# Patient Record
Sex: Male | Born: 1960 | Race: Black or African American | Hispanic: No | Marital: Single | Smoking: Current every day smoker
Health system: Southern US, Community
[De-identification: ages and names within clinical notes are randomized; demographics above are authoritative.]

---

## 2014-10-13 ENCOUNTER — Emergency Department (HOSPITAL_COMMUNITY)
Admission: EM | Admit: 2014-10-13 | Discharge: 2014-10-13 | Disposition: A | Discharge status: Home / Self Care | Payer: Worker's Compensation | Attending: Emergency Medicine | Admitting: Emergency Medicine

## 2014-10-13 ENCOUNTER — Emergency Department (HOSPITAL_COMMUNITY): Payer: Worker's Compensation

## 2014-10-13 ENCOUNTER — Encounter (HOSPITAL_COMMUNITY): Payer: Self-pay | Admitting: Emergency Medicine

## 2014-10-13 DIAGNOSIS — Y9289 Other specified places as the place of occurrence of the external cause: Secondary | ICD-10-CM | POA: Insufficient documentation

## 2014-10-13 DIAGNOSIS — M549 Dorsalgia, unspecified: Secondary | ICD-10-CM

## 2014-10-13 DIAGNOSIS — S3992XA Unspecified injury of lower back, initial encounter: Secondary | ICD-10-CM | POA: Diagnosis not present

## 2014-10-13 DIAGNOSIS — Y9389 Activity, other specified: Secondary | ICD-10-CM | POA: Insufficient documentation

## 2014-10-13 DIAGNOSIS — Z720 Tobacco use: Secondary | ICD-10-CM | POA: Insufficient documentation

## 2014-10-13 DIAGNOSIS — Z79899 Other long term (current) drug therapy: Secondary | ICD-10-CM | POA: Diagnosis not present

## 2014-10-13 MED ORDER — IBUPROFEN 800 MG PO TABS: 800.0000 mg | ORAL_TABLET | Freq: Three times a day (TID) | ORAL | Status: DC

## 2014-10-13 MED ORDER — HYDROCODONE-ACETAMINOPHEN 5-325 MG PO TABS: 2.0000 | ORAL_TABLET | ORAL | Status: DC | PRN

## 2014-10-13 NOTE — Discharge Instructions (Signed)
Back Pain, Adult °Low back pain is very common. About 1 in 5 people have back pain. The cause of low back pain is rarely dangerous. The pain often gets better over time. About half of people with a sudden onset of back pain feel better in just 2 weeks. About 8 in 10 people feel better by 6 weeks.  °CAUSES °Some common causes of back pain include: °· Strain of the muscles or ligaments supporting the spine. °· Wear and tear (degeneration) of the spinal discs. °· Arthritis. °· Direct injury to the back. °DIAGNOSIS °Most of the time, the direct cause of low back pain is not known. However, back pain can be treated effectively even when the exact cause of the pain is unknown. Answering your caregiver's questions about your overall health and symptoms is one of the most accurate ways to make sure the cause of your pain is not dangerous. If your caregiver needs more information, he or she may order lab work or imaging tests (X-rays or MRIs). However, even if imaging tests show changes in your back, this usually does not require surgery. °HOME CARE INSTRUCTIONS °For many people, back pain returns. Since low back pain is rarely dangerous, it is often a condition that people can learn to manage on their own.  °· Remain active. It is stressful on the back to sit or stand in one place. Do not sit, drive, or stand in one place for more than 30 minutes at a time. Take short walks on level surfaces as soon as pain allows. Try to increase the length of time you walk each day. °· Do not stay in bed. Resting more than 1 or 2 days can delay your recovery. °· Do not avoid exercise or work. Your body is made to move. It is not dangerous to be active, even though your back may hurt. Your back will likely heal faster if you return to being active before your pain is gone. °· Pay attention to your body when you  bend and lift. Many people have less discomfort when lifting if they bend their knees, keep the load close to their bodies, and  avoid twisting. Often, the most comfortable positions are those that put less stress on your recovering back. °· Find a comfortable position to sleep. Use a firm mattress and lie on your side with your knees slightly bent. If you lie on your back, put a pillow under your knees. °· Only take over-the-counter or prescription medicines as directed by your caregiver. Over-the-counter medicines to reduce pain and inflammation are often the most helpful. Your caregiver may prescribe muscle relaxant drugs. These medicines help dull your pain so you can more quickly return to your normal activities and healthy exercise. °· Put ice on the injured area. °¨ Put ice in a plastic bag. °¨ Place a towel between your skin and the bag. °¨ Leave the ice on for 15-20 minutes, 03-04 times a day for the first 2 to 3 days. After that, ice and heat may be alternated to reduce pain and spasms. °· Ask your caregiver about trying back exercises and gentle massage. This may be of some benefit. °· Avoid feeling anxious or stressed. Stress increases muscle tension and can worsen back pain. It is important to recognize when you are anxious or stressed and learn ways to manage it. Exercise is a great option. °SEEK MEDICAL CARE IF: °· You have pain that is not relieved with rest or medicine. °· You have pain that does not improve in 1 week. °· You have new symptoms. °· You are generally not feeling well. °SEEK   IMMEDIATE MEDICAL CARE IF:  °· You have pain that radiates from your back into your legs. °· You develop new bowel or bladder control problems. °· You have unusual weakness or numbness in your arms or legs. °· You develop nausea or vomiting. °· You develop abdominal pain. °· You feel faint. °Document Released: 12/01/2005 Document Revised: 06/01/2012 Document Reviewed: 04/04/2014 °Exit[employer]® Patient Information ©2015 [employer]. This information is not intended to replace advice given to you by your health care provider. Make sure you  discuss any questions you have with your health care provider. ° °Contusion °A contusion is a deep bruise. Contusions are the result of an injury that caused bleeding under the skin. The contusion may turn blue, purple, or yellow. Minor injuries will give you a painless contusion, but more severe contusions may stay painful and swollen for a few weeks.  °CAUSES  °A contusion is usually caused by a blow, trauma, or direct force to an area of the body. °SYMPTOMS  °· Swelling and redness of the injured area. °· Bruising of the injured area. °· Tenderness and soreness of the injured area. °· Pain. °DIAGNOSIS  °The diagnosis can be made by taking a history and physical exam. An X-ray, CT scan, or MRI may be needed to determine if there were any associated injuries, such as fractures. °TREATMENT  °Specific treatment will depend on what area of the body was injured. In general, the best treatment for a contusion is resting, icing, elevating, and applying cold compresses to the injured area. Over-the-counter medicines may also be recommended for pain control. Ask your caregiver what the best treatment is for your contusion. °HOME CARE INSTRUCTIONS  °· Put ice on the injured area. °¨ Put ice in a plastic bag. °¨ Place a towel between your skin and the bag. °¨ Leave the ice on for 15-20 minutes, 3-4 times a day, or as directed by your health care provider. °· Only take over-the-counter or prescription medicines for pain, discomfort, or fever as directed by your caregiver. Your caregiver may recommend avoiding anti-inflammatory medicines (aspirin, ibuprofen, and naproxen) for 48 hours because these medicines may increase bruising. °· Rest the injured area. °· If possible, elevate the injured area to reduce swelling. °SEEK IMMEDIATE MEDICAL CARE IF:  °· You have increased bruising or swelling. °· You have pain that is getting worse. °· Your swelling or pain is not relieved with medicines. °MAKE SURE YOU:  °· Understand these  instructions. °· Will watch your condition. °· Will get help right away if you are not doing well or get worse. °Document Released: 09/10/2005 Document Revised: 12/06/2013 Document Reviewed: 10/06/2011 °ExitCare® Patient Information ©2015 [employer]. This information is not intended to replace advice given to you by your health care provider. Make sure you discuss any questions you have with your health care provider. ° °

## 2014-10-13 NOTE — ED Provider Notes (Signed)
CSN: [id number]     Arrival date & time 10/13/14  1042 History   First MD Initiated Contact with Patient 10/13/14 1127     Chief Complaint  Patient presents with  . Fall  . Back Pain     (Consider location/radiation/quality/duration/timing/severity/associated sxs/prior Treatment) Patient is a 53 y.o. male presenting with fall and back pain. The history is provided by the patient. No language interpreter was used.  Fall This is a new problem. The current episode started today. The problem occurs constantly. The problem has been gradually worsening. Associated symptoms include myalgias. Nothing aggravates the symptoms. He has tried nothing for the symptoms. The treatment provided moderate relief.  Back Pain Pt complains of pain in his low back after falling off the back of a truck yesterday  History reviewed. No pertinent past medical history. History reviewed. No pertinent past surgical history. No family history on file. History  Substance Use Topics  . Smoking status: Current Every Day Smoker    Types: Cigarettes  . Smokeless tobacco: Not on file  . Alcohol Use: Yes    Review of Systems  Musculoskeletal: Positive for back pain and myalgias.  All other systems reviewed and are negative.     Allergies  Review of patient's allergies indicates no known allergies.  Home Medications   Prior to Admission medications   Medication Sig Start Date End Date Taking? Authorizing Provider  Cyanocobalamin (B-12 PO) Take 1 tablet by mouth daily.   Yes Historical Provider, MD  FOLIC ACID PO Take 1 tablet by mouth daily.   Yes Historical Provider, MD  IRON PO Take 1 tablet by mouth daily.   Yes Historical Provider, MD  Multiple Vitamins-Minerals (MULTIVITAL) tablet Take 1 tablet by mouth daily.   Yes Historical Provider, MD  Multiple Vitamins-Minerals (PREVENT PO) Take 2 tablets by mouth daily. For Mens Prostate health.   Yes Historical Provider, MD   BP 133/76  Pulse 53  Temp(Src)  97.8 F (36.6 C) (Oral)  Resp 17  SpO2 100% Physical Exam  Nursing note and vitals reviewed. Constitutional: He is oriented to person, place, and time. He appears well-developed and well-nourished.  HENT:  Head: Normocephalic.  Eyes: EOM are normal.  Neck: Normal range of motion.  Cardiovascular: Normal rate.   Pulmonary/Chest: Effort normal.  Abdominal: He exhibits no distension.  Musculoskeletal:  Abrasion left upper back,  Tender mid lower back,  From  nv and ns intact  Neurological: He is alert and oriented to person, place, and time.  Skin: Skin is warm.  Psychiatric: He has a normal mood and affect.    ED Course  Procedures (including critical care time) Labs Review Labs Reviewed - No data to display  Imaging Review Dg Lumbar Spine Complete  10/13/2014   CLINICAL DATA:  Fall from a truck with impact in the low back. Mid low back pain, backache, initial encounter.  EXAM: LUMBAR SPINE - COMPLETE 4+ VIEW  COMPARISON:  None.  FINDINGS: Alignment is anatomic. Vertebral body height is maintained. Mild endplate degenerative changes in the lower thoracic spine and L1-2. No definite pars defects.  IMPRESSION: 1. No acute findings. 2. Lower thoracic and upper lumbar spondylosis, mild.   Electronically Signed   By: Leanna BattlesMelinda  Blietz M.D.   On: 10/13/2014 12:00     EKG Interpretation None      MDM   Final diagnoses:  Back ache    Rx for hydrocodone Ibuprofen     Elson AreasLeslie K Laurrie Toppin, PA-C 10/13/14 1542  Elson AreasLeslie K Tovia Kisner, PA-C 10/13/14 [phone]

## 2014-10-13 NOTE — ED Notes (Signed)
Pt was at work and fell hurting his lower left side of his back.  Pt denies hitting his head or LOC.

## 2014-10-13 NOTE — ED Notes (Addendum)
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 Pt ambulatory since event.

## 2014-10-13 NOTE — ED Provider Notes (Signed)
Medical screening examination/treatment/procedure(s) were performed by non-physician practitioner and as supervising physician I was immediately available for consultation/collaboration.     Keyuana Wank DeloGeoffery Lyons, MD 10/13/14 [phone]

## 2015-06-03 IMAGING — CR DG LUMBAR SPINE COMPLETE 4+V
5 series · 5 of 5 positions shown · non-contrast
Comparison: None.

CLINICAL DATA: Fall from a truck with impact in the low back. Mid
low back pain, backache, initial encounter.

EXAM:
LUMBAR SPINE - COMPLETE 4+ VIEW

[t lumbar spine ap]
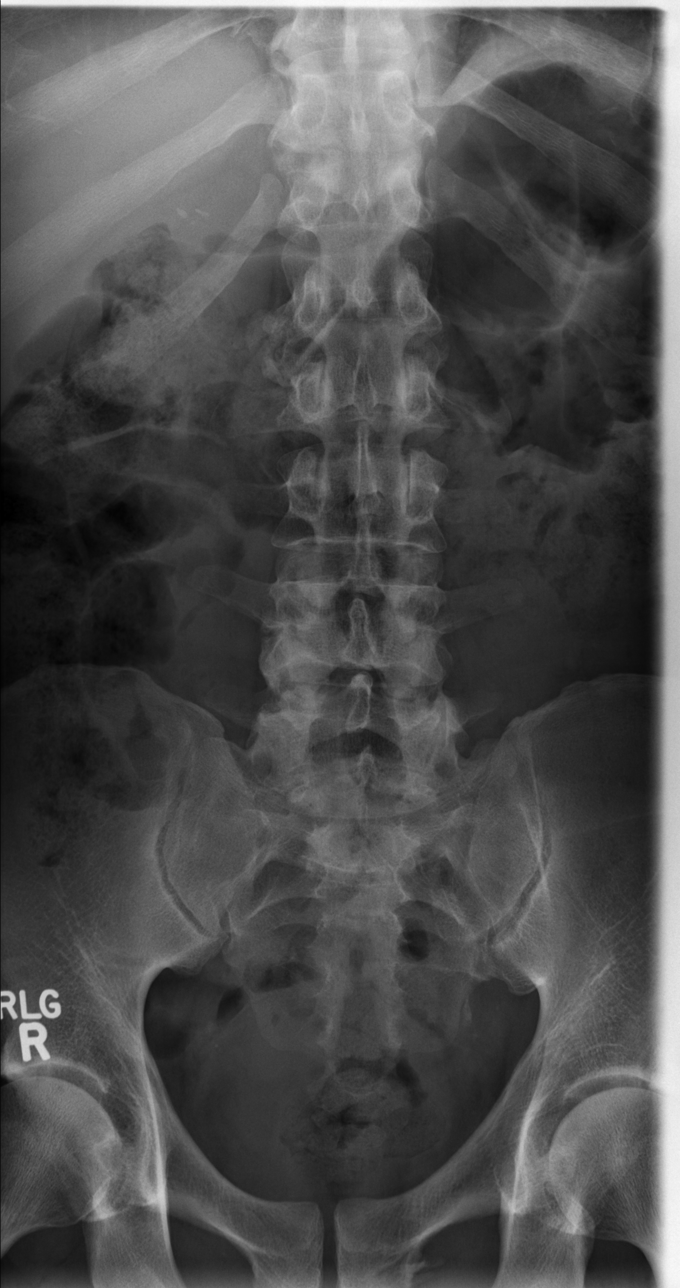

[t lumbar spine obl (1 of 2)]
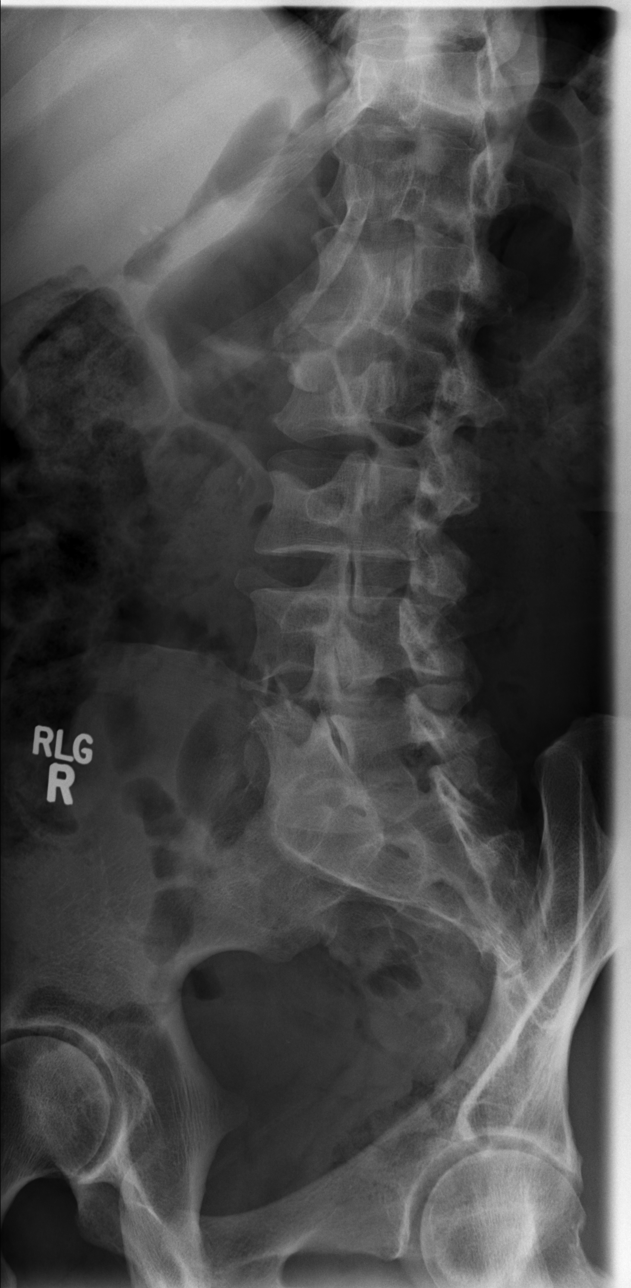

[t lumbar spine obl (2 of 2)]
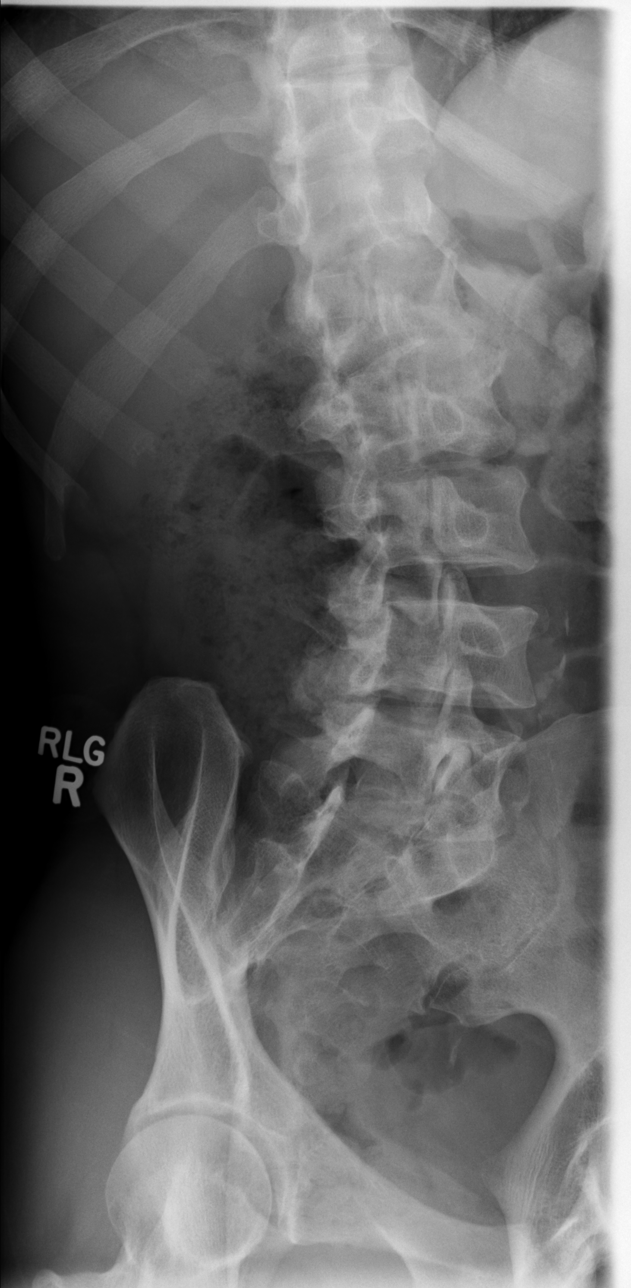

[t lumbar spine lat]
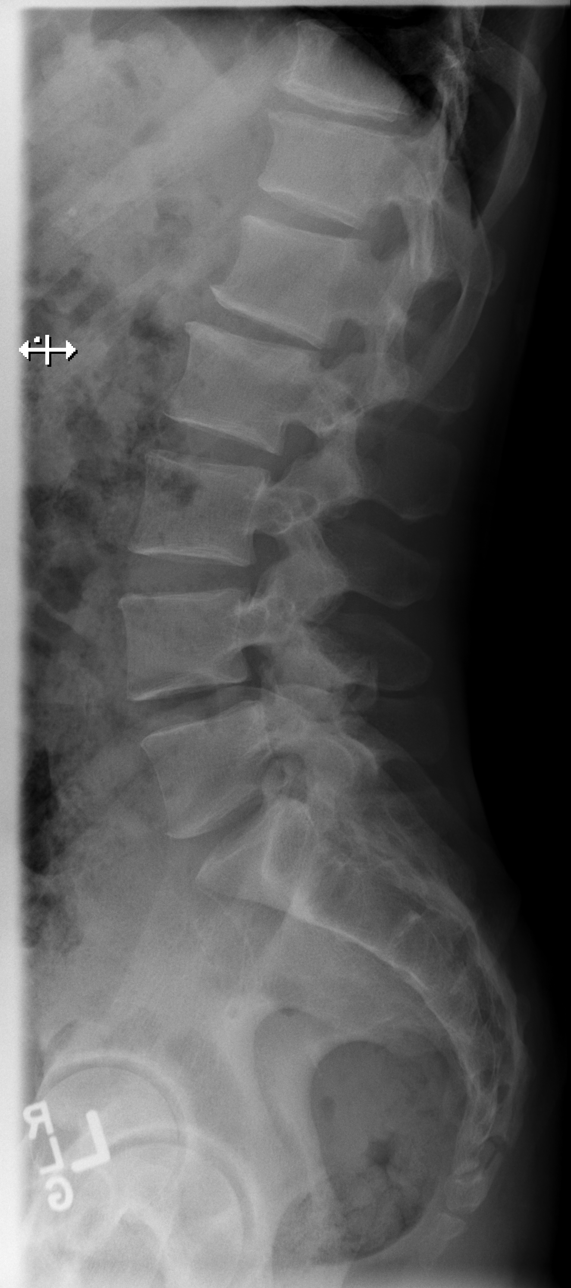

[t lumbar l-5 s-1 spot]
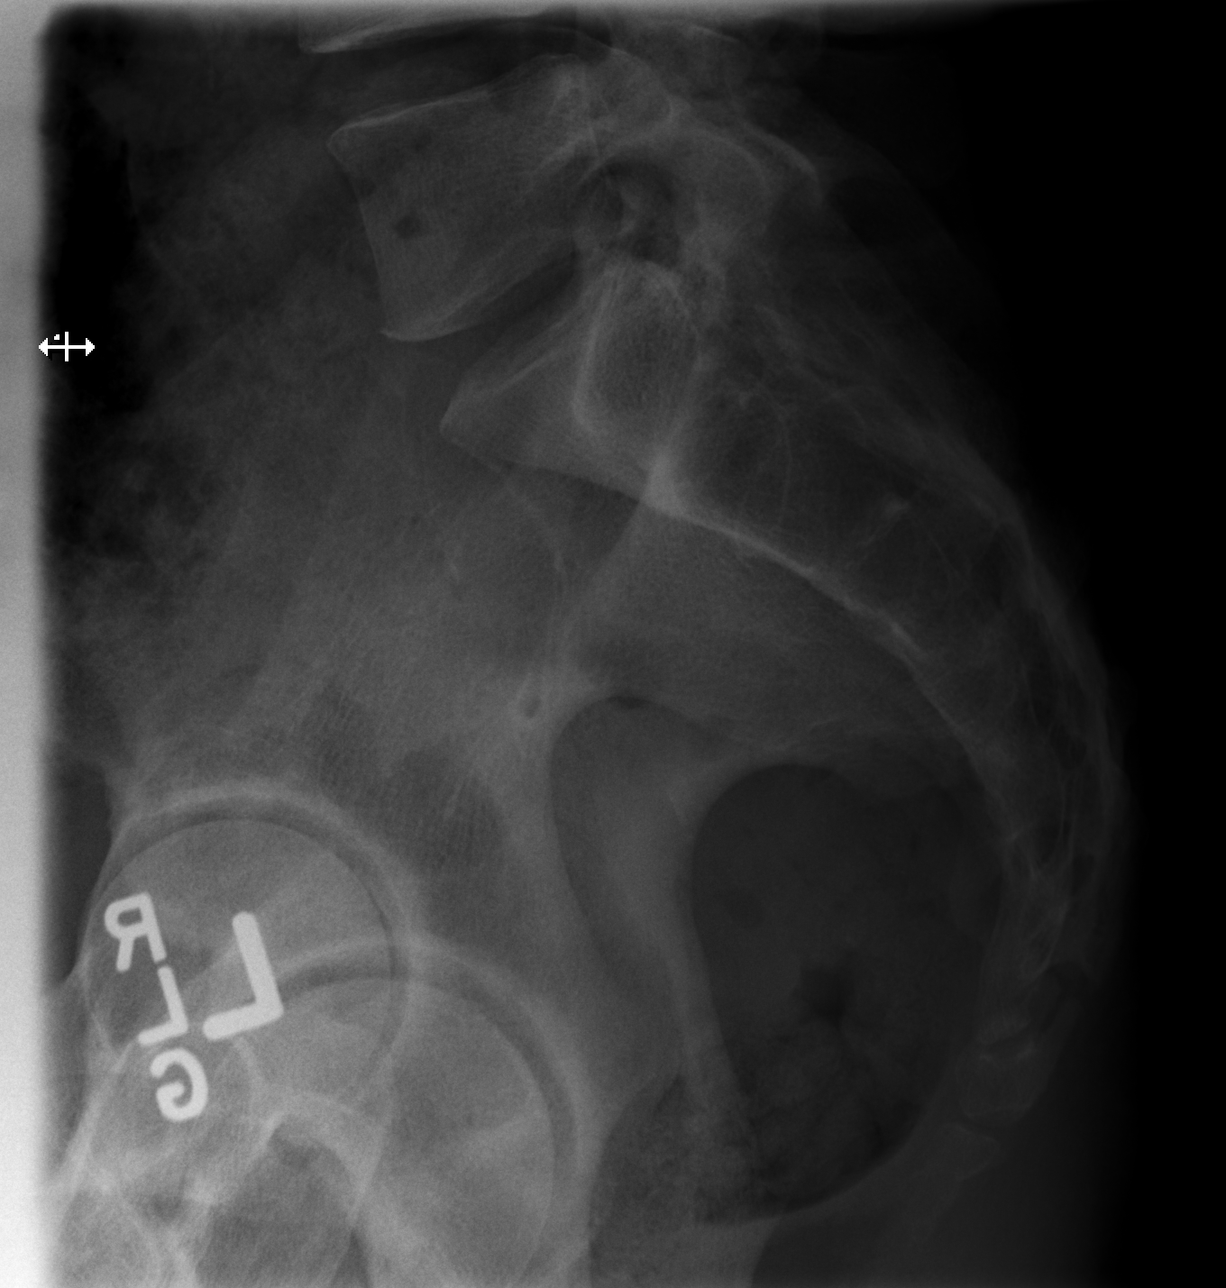

[5 of 5 positions shown; findings below may reference images not displayed]

FINDINGS: Alignment is anatomic. Vertebral body height is maintained. Mild
endplate degenerative changes in the lower thoracic spine and L1-2.
No definite pars defects.
IMPRESSION: 1. No acute findings.
2. Lower thoracic and upper lumbar spondylosis, mild.

## 2021-05-24 ENCOUNTER — Encounter (HOSPITAL_COMMUNITY): Payer: Self-pay | Admitting: Emergency Medicine

## 2021-05-24 ENCOUNTER — Ambulatory Visit (HOSPITAL_COMMUNITY)
Admission: EM | Admit: 2021-05-24 | Discharge: 2021-05-24 | Disposition: A | Discharge status: Home / Self Care | Payer: Self-pay | Attending: Urgent Care | Admitting: Urgent Care

## 2021-05-24 ENCOUNTER — Other Ambulatory Visit: Payer: Self-pay

## 2021-05-24 DIAGNOSIS — M545 Low back pain, unspecified: Secondary | ICD-10-CM

## 2021-05-24 DIAGNOSIS — S39012A Strain of muscle, fascia and tendon of lower back, initial encounter: Secondary | ICD-10-CM

## 2021-05-24 MED ORDER — PREDNISONE 20 MG PO TABS: ORAL_TABLET | ORAL | 0 refills | Status: DC

## 2021-05-24 MED ORDER — TIZANIDINE HCL 4 MG PO TABS: 4.0000 mg | ORAL_TABLET | Freq: Three times a day (TID) | ORAL | 0 refills | Status: DC | PRN

## 2021-05-24 NOTE — ED Provider Notes (Signed)
[facility]   MRN: [id number] DOB: 18-Feb-1961  Subjective:   [facility] is a 60 y.o. male presenting for 3-day history of acute onset persistent right mid to lower back pain, tightness that radiates down his right flank side.  Patient was moving really heavy furniture and performing strenuous lifting activities before his back pain started.  Denies any fall, trauma, car accident.  No hematuria, dysuria, history of kidney stone.  No current facility-administered medications for this encounter.  Current Outpatient Medications:    Cyanocobalamin (B-12 PO), Take 1 tablet by mouth daily., Disp: , Rfl:    FOLIC ACID PO, Take 1 tablet by mouth daily., Disp: , Rfl:    HYDROcodone-acetaminophen (NORCO/VICODIN) 5-325 MG per tablet, Take 2 tablets by mouth every 4 (four) hours as needed., Disp: 10 tablet, Rfl: 0   ibuprofen (ADVIL,MOTRIN) 800 MG tablet, Take 1 tablet (800 mg total) by mouth 3 (three) times daily., Disp: 21 tablet, Rfl: 0   IRON PO, Take 1 tablet by mouth daily., Disp: , Rfl:    Multiple Vitamins-Minerals (MULTIVITAL) tablet, Take 1 tablet by mouth daily., Disp: , Rfl:    Multiple Vitamins-Minerals (PREVENT PO), Take 2 tablets by mouth daily. For Mens Prostate health., Disp: , Rfl:    No Known Allergies  History reviewed. No pertinent past medical history.   History reviewed. No pertinent surgical history.  History reviewed. No pertinent family history.  Social History   Tobacco Use   Smoking status: Every Day    Pack years: 0.00    Types: Cigarettes  Substance Use Topics   Alcohol use: Yes    ROS   Objective:   Vitals: BP 140/84 (BP Location: Right Arm)   Pulse 62   Temp 98.4 F (36.9 C) (Oral)   Resp 16   SpO2 97%   Physical Exam Constitutional:      General: He is not in acute distress.    Appearance: Normal appearance. He is well-developed and normal weight. He is not ill-appearing, toxic-appearing or diaphoretic.  HENT:      Head: Normocephalic and atraumatic.     Right Ear: External ear normal.     Left Ear: External ear normal.     Nose: Nose normal.     Mouth/Throat:     Pharynx: Oropharynx is clear.  Eyes:     General: No scleral icterus.       Right eye: No discharge.        Left eye: No discharge.     Extraocular Movements: Extraocular movements intact.     Pupils: Pupils are equal, round, and reactive to light.  Cardiovascular:     Rate and Rhythm: Normal rate.  Pulmonary:     Effort: Pulmonary effort is normal.  Musculoskeletal:     Cervical back: Normal range of motion.     Thoracic back: Spasms and tenderness present. No swelling, edema, deformity, signs of trauma, lacerations or bony tenderness. Decreased range of motion. No scoliosis.     Lumbar back: Spasms and tenderness present. No swelling, edema, deformity, signs of trauma, lacerations or bony tenderness. Decreased range of motion. Negative right straight leg raise test and negative left straight leg raise test. No scoliosis.       Back:  Neurological:     Mental Status: He is alert and oriented to person, place, and time.  Psychiatric:        Mood and Affect: Mood normal.  Behavior: Behavior normal.        Thought Content: Thought content normal.        Judgment: Judgment normal.      Assessment and Plan :   PDMP not reviewed this encounter.  1. Acute right-sided low back pain without sciatica   2. Strain of lumbar region, initial encounter     Will manage conservatively for back strain with NSAID and muscle relaxant, rest and modification of physical activity.  Anticipatory guidance provided.  Counseled patient on potential for adverse effects with medications prescribed/recommended today, ER and return-to-clinic precautions discussed, patient verbalized understanding.    Wallis Bamberg, PA-C 05/24/21 1131

## 2021-05-24 NOTE — ED Triage Notes (Signed)
Pt presents with right side lower back pain and hip pain Xs 3 days. States was moving furniture up steps on Thursday, but denies injury.

## 2024-05-05 ENCOUNTER — Telehealth (INDEPENDENT_AMBULATORY_CARE_PROVIDER_SITE_OTHER): Payer: Self-pay | Admitting: Primary Care

## 2024-05-05 NOTE — Telephone Encounter (Signed)
 Left VM with pt about their upcoming appt.

## 2024-05-12 ENCOUNTER — Telehealth (INDEPENDENT_AMBULATORY_CARE_PROVIDER_SITE_OTHER): Payer: Self-pay | Admitting: Primary Care

## 2024-05-12 NOTE — Telephone Encounter (Signed)
 Left VM with pt about their upcoming appt.

## 2024-05-13 ENCOUNTER — Encounter (INDEPENDENT_AMBULATORY_CARE_PROVIDER_SITE_OTHER): Payer: Self-pay | Admitting: Primary Care

## 2024-05-13 ENCOUNTER — Ambulatory Visit (INDEPENDENT_AMBULATORY_CARE_PROVIDER_SITE_OTHER): Admitting: Primary Care

## 2024-05-13 VITALS — BP 140/84 | HR 60 | Resp 16 | Ht 66.0 in | Wt 165.6 lb

## 2024-05-13 DIAGNOSIS — R351 Nocturia: Secondary | ICD-10-CM

## 2024-05-13 DIAGNOSIS — Z1211 Encounter for screening for malignant neoplasm of colon: Secondary | ICD-10-CM | POA: Diagnosis not present

## 2024-05-13 DIAGNOSIS — Z2821 Immunization not carried out because of patient refusal: Secondary | ICD-10-CM

## 2024-05-13 DIAGNOSIS — Z13220 Encounter for screening for lipoid disorders: Secondary | ICD-10-CM

## 2024-05-13 DIAGNOSIS — R030 Elevated blood-pressure reading, without diagnosis of hypertension: Secondary | ICD-10-CM | POA: Diagnosis not present

## 2024-05-13 DIAGNOSIS — Z1159 Encounter for screening for other viral diseases: Secondary | ICD-10-CM

## 2024-05-13 DIAGNOSIS — Z114 Encounter for screening for human immunodeficiency virus [HIV]: Secondary | ICD-10-CM | POA: Diagnosis not present

## 2024-05-13 DIAGNOSIS — Z833 Family history of diabetes mellitus: Secondary | ICD-10-CM | POA: Diagnosis not present

## 2024-05-13 NOTE — Progress Notes (Signed)
 New Patient Office Visit  Subjective    Patient ID: Edward Wu male  DOB: September 17, 1961  Age: 63 y.o. MRN: [id number]   CC:  Establishment of care HPI     Sinus Problem    Additional comments: Was taking sudafed  Still has some mucous come up       Last edited by Kathryne Parisian, RMA on 05/13/2024  9:27 AM.      Sinus Problem    [name] is a 63 year old male who presents today to establish care.  Blood pressure is elevated 150/85.Patient has No headache, No chest pain, No abdominal pain - No Nausea, No new weakness tingling or numbness, No Cough - shortness of breath.  He does experience throbbing pain temporal area on the left side. He recently got over cold cough and congestion and was using Sudafed for treatment.  Explain that can elevate your blood pressure.  He is not taking this medication at this time.  However he does have some phlegm lingering. No current outpatient medications on file prior to visit.   No current facility-administered medications on file prior to visit.     No Known Allergies  History reviewed. No pertinent past medical history.   History reviewed. No pertinent surgical history.   Family History  Problem Relation Age of Onset   Diabetes Mother    Diabetes Brother    Social History   Socioeconomic History   Marital status: Single    Spouse name: Not on file   Number of children: Not on file   Years of education: Not on file   Highest education level: Not on file  Occupational History   Not on file  Tobacco Use   Smoking status: Every Day    Types: Cigarettes   Smokeless tobacco: Never  Vaping Use   Vaping status: Never Used  Substance and Sexual Activity   Alcohol use: Yes   Drug use: Not on file   Sexual activity: Not on file  Other Topics Concern   Not on file  Social History Narrative   Not on file   Social Drivers of Health   Financial Resource Strain: Not on file  Food Insecurity: No Food Insecurity  (05/13/2024)   Hunger Vital Sign    Worried About Running Out of Food in the Last Year: Never true    Ran Out of Food in the Last Year: Never true  Transportation Needs: No Transportation Needs (05/13/2024)   PRAPARE - Administrator, Civil Service (Medical): No    Lack of Transportation (Non-Medical): No  Physical Activity: Not on file  Stress: Not on file  Social Connections: Not on file  Intimate Partner Violence: Not At Risk (05/13/2024)   Humiliation, Afraid, Rape, and Kick questionnaire    Fear of Current or Ex-Partner: No    Emotionally Abused: No    Physically Abused: No    Sexually Abused: No   SDOH Interventions Today    Flowsheet Row Most Recent Value  SDOH Interventions   Food Insecurity Interventions Intervention Not Indicated  Housing Interventions Intervention Not Indicated  Transportation Interventions Intervention Not Indicated  Utilities Interventions Intervention Not Indicated       Objective    BP (!) 140/84   Pulse 60   Resp 16   Ht 5\' 6"  (1.676 m)   Wt 165 lb 9.6 oz (75.1 kg)   SpO2 97%   BMI 26.73 kg/m  BP Readings from Last 3 Encounters:  05/13/24 (!) 140/84  05/24/21 140/84  10/13/14 131/84   Physical Exam Vitals reviewed.  Constitutional:      Appearance: He is normal weight.  HENT:     Head: Normocephalic.     Right Ear: External ear normal. There is impacted cerumen.     Left Ear: Tympanic membrane, ear canal and external ear normal.     Nose: Nose normal.  Eyes:     Extraocular Movements: Extraocular movements intact.     Pupils: Pupils are equal, round, and reactive to light.  Cardiovascular:     Rate and Rhythm: Normal rate and regular rhythm.  Pulmonary:     Effort: Pulmonary effort is normal.     Breath sounds: Normal breath sounds.  Abdominal:     General: Bowel sounds are normal. There is distension.     Palpations: Abdomen is soft.  Musculoskeletal:        General: Normal range of motion.     Cervical back:  Normal range of motion.  Skin:    General: Skin is warm and dry.  Neurological:     Mental Status: He is alert and oriented to person, place, and time.  Psychiatric:        Mood and Affect: Mood normal.        Behavior: Behavior normal.        Thought Content: Thought content normal.        Judgment: Judgment normal.       Assessment & Plan:  Rane was seen today for new patient (initial visit) and sinus problem.  Diagnoses and all orders for this visit:  Herpes zoster vaccination declined  Pneumococcal vaccination declined  Encounter for HCV screening test for low risk patient -     HCV Ab w Reflex to Quant PCR  Encounter for screening for HIV -     HIV Antibody (routine testing w rflx)  Elevated blood pressure reading without diagnosis of hypertension DIET: Limit salt intake, read nutrition labels to check salt content, limit fried and high fatty foods  Avoid using multisymptom OTC cold preparations that generally contain sudafed which can rise BP. Consult with pharmacist on best cold relief products to use for persons with HTN EXERCISE Discussed incorporating exercise such as walking - 30 minutes most days of the week and can do in 10 minute intervals    -     CMP14+EGFR  Lipid screening -     Lipid panel  Nocturia Gets up 3-4 times a night -     PSA  Family history of diabetes mellitus in mother -     CBC with Differential/Platelet -     Hemoglobin A1c  Colon cancer screening -     Ambulatory referral to Gastroenterology   Follow-up:    The above assessment and management plan was discussed with the patient. The patient verbalized understanding of and has agreed to the management plan. Patient is aware to call the clinic if symptoms fail to improve or worsen. Patient is aware when to return to the clinic for a follow-up visit. Patient educated on when it is appropriate to go to the emergency department.   Madelyn Schick, NP-C

## 2024-05-14 LAB — CMP14+EGFR
ALT: 23 IU/L (ref 0–44)
AST: 32 IU/L (ref 0–40)
Albumin: 4.2 g/dL (ref 3.9–4.9)
Alkaline Phosphatase: 75 IU/L (ref 44–121)
BUN/Creatinine Ratio: 16 (ref 10–24)
BUN: 17 mg/dL (ref 8–27)
Bilirubin Total: 0.4 mg/dL (ref 0.0–1.2)
CO2: 21 mmol/L (ref 20–29)
Calcium: 9 mg/dL (ref 8.6–10.2)
Chloride: 104 mmol/L (ref 96–106)
Creatinine, Ser: 1.04 mg/dL (ref 0.76–1.27)
Globulin, Total: 2.9 g/dL (ref 1.5–4.5)
Glucose: 87 mg/dL (ref 70–99)
Potassium: 5.1 mmol/L (ref 3.5–5.2)
Sodium: 139 mmol/L (ref 134–144)
Total Protein: 7.1 g/dL (ref 6.0–8.5)
eGFR: 80 mL/min/{1.73_m2} (ref >59)

## 2024-05-14 LAB — LIPID PANEL
Chol/HDL Ratio: 4.1 ratio (ref 0.0–5.0)
Cholesterol, Total: 214 mg/dL — ABNORMAL HIGH (ref 100–199)
HDL: 52 mg/dL (ref >39)
LDL Chol Calc (NIH): 144 mg/dL — ABNORMAL HIGH (ref 0–99)
Triglycerides: 101 mg/dL (ref 0–149)
VLDL Cholesterol Cal: 18 mg/dL (ref 5–40)

## 2024-05-14 LAB — CBC WITH DIFFERENTIAL/PLATELET
Basophils Absolute: 0.1 10*3/uL (ref 0.0–0.2)
Basos: 1 %
EOS (ABSOLUTE): 0.3 10*3/uL (ref 0.0–0.4)
Eos: 6 %
Hematocrit: 46.2 % (ref 37.5–51.0)
Hemoglobin: 15.1 g/dL (ref 13.0–17.7)
Immature Grans (Abs): 0 10*3/uL (ref 0.0–0.1)
Immature Granulocytes: 0 %
Lymphocytes Absolute: 2.6 10*3/uL (ref 0.7–3.1)
Lymphs: 44 %
MCH: 30.3 pg (ref 26.6–33.0)
MCHC: 32.7 g/dL (ref 31.5–35.7)
MCV: 93 fL (ref 79–97)
Monocytes Absolute: 0.4 10*3/uL (ref 0.1–0.9)
Monocytes: 7 %
Neutrophils Absolute: 2.5 10*3/uL (ref 1.4–7.0)
Neutrophils: 42 %
Platelets: 315 10*3/uL (ref 150–450)
RBC: 4.98 x10E6/uL (ref 4.14–5.80)
RDW: 14.4 % (ref 11.6–15.4)
WBC: 6 10*3/uL (ref 3.4–10.8)

## 2024-05-14 LAB — HCV INTERPRETATION

## 2024-05-14 LAB — HEMOGLOBIN A1C
Est. average glucose Bld gHb Est-mCnc: 111 mg/dL
Hgb A1c MFr Bld: 5.5 % (ref 4.8–5.6)

## 2024-05-14 LAB — HIV ANTIBODY (ROUTINE TESTING W REFLEX): HIV Screen 4th Generation wRfx: NONREACTIVE

## 2024-05-14 LAB — PSA: Prostate Specific Ag, Serum: 1.6 ng/mL (ref 0.0–4.0)

## 2024-05-14 LAB — HCV AB W REFLEX TO QUANT PCR: HCV Ab: NONREACTIVE

## 2024-05-20 ENCOUNTER — Ambulatory Visit (INDEPENDENT_AMBULATORY_CARE_PROVIDER_SITE_OTHER): Payer: Self-pay | Admitting: Primary Care

## 2024-05-20 ENCOUNTER — Other Ambulatory Visit (INDEPENDENT_AMBULATORY_CARE_PROVIDER_SITE_OTHER): Payer: Self-pay | Admitting: Primary Care

## 2024-05-20 DIAGNOSIS — E782 Mixed hyperlipidemia: Secondary | ICD-10-CM

## 2024-05-20 MED ORDER — ATORVASTATIN CALCIUM 20 MG PO TABS: 20.0000 mg | ORAL_TABLET | Freq: Every day | ORAL | 3 refills | Status: AC

## 2024-05-27 ENCOUNTER — Encounter (INDEPENDENT_AMBULATORY_CARE_PROVIDER_SITE_OTHER): Payer: Self-pay

## 2024-06-27 ENCOUNTER — Ambulatory Visit: Payer: Medicaid Other | Admitting: Internal Medicine

## 2024-08-12 ENCOUNTER — Telehealth (INDEPENDENT_AMBULATORY_CARE_PROVIDER_SITE_OTHER): Payer: Self-pay | Admitting: Primary Care

## 2024-08-12 NOTE — Telephone Encounter (Signed)
 Called pt to confirm appt. Pt will be present.

## 2024-08-16 ENCOUNTER — Ambulatory Visit (INDEPENDENT_AMBULATORY_CARE_PROVIDER_SITE_OTHER): Admitting: Primary Care

## 2024-08-25 ENCOUNTER — Telehealth (INDEPENDENT_AMBULATORY_CARE_PROVIDER_SITE_OTHER): Payer: Self-pay | Admitting: Primary Care

## 2024-08-25 NOTE — Telephone Encounter (Signed)
 Called pt to confirm appt. Pt will be present.

## 2024-08-26 ENCOUNTER — Ambulatory Visit (INDEPENDENT_AMBULATORY_CARE_PROVIDER_SITE_OTHER): Admitting: Primary Care

## 2024-08-26 ENCOUNTER — Encounter (INDEPENDENT_AMBULATORY_CARE_PROVIDER_SITE_OTHER): Payer: Self-pay | Admitting: Primary Care

## 2024-08-26 VITALS — BP 144/80 | HR 53 | Wt 161.0 lb

## 2024-08-26 DIAGNOSIS — E782 Mixed hyperlipidemia: Secondary | ICD-10-CM

## 2024-08-26 DIAGNOSIS — H6121 Impacted cerumen, right ear: Secondary | ICD-10-CM | POA: Diagnosis not present

## 2024-08-26 DIAGNOSIS — Z1211 Encounter for screening for malignant neoplasm of colon: Secondary | ICD-10-CM

## 2024-08-26 NOTE — Patient Instructions (Signed)
 Preventing Hypertension Hypertension, also called high blood pressure, is when the force of blood pumping through the arteries is too strong. Arteries are blood vessels that carry blood from the heart throughout the body. Often, hypertension does not cause symptoms until blood pressure is very high. It is important to have your blood pressure checked regularly. Diet and lifestyle changes can help you prevent hypertension, and they may make you feel better overall and improve your quality of life. If you already have hypertension, you may control it with diet and lifestyle changes, as well as with medicine. How can this condition affect me? Over time, hypertension can damage the arteries and decrease blood flow to important parts of the body, including the brain, heart, and kidneys. By keeping your blood pressure in a healthy range, you can help prevent complications like heart attack, heart failure, stroke, kidney failure, and vascular dementia. What can increase my risk? An unhealthy diet and a lack of physical activity can make you more likely to develop high blood pressure. Some other risk factors include: Age. The risk increases with age. Having family members who have had high blood pressure. Having certain health conditions, such as thyroid problems. Being overweight or obese. Drinking too much alcohol or caffeine. Having too much fat, sugar, calories, or salt (sodium) in your diet. Smoking or using illegal drugs. Taking certain medicines, such as antidepressants, decongestants, birth control pills, and NSAIDs, such as ibuprofen. What actions can I take to prevent or manage this condition? Work with your health care provider to make a hypertension prevention plan that works for you. You may be referred for counseling on a healthy diet and physical activity. Follow your plan and keep all follow-up visits. Diet changes Maintain a healthy diet. This includes: Eating less salt (sodium). Ask your  health care provider how much sodium is safe for you to have. The general recommendation is to have less than 1 tsp (2,300 mg) of sodium a day. Do not add salt to your food. Choose low-sodium options when grocery shopping and eating out. Limiting fats in your diet. You can do this by eating low-fat or fat-free dairy products and by eating less red meat. Eating more fruits, vegetables, and whole grains. Make a goal to eat: 1-2 cups of fresh fruits and vegetables each day. 3-4 servings of whole grains each day. Avoiding foods and beverages that have added sugars. Eating fish that contain healthy fats (omega-3 fatty acids), such as mackerel or salmon. If you need help putting together a healthy eating plan, try the DASH diet. This diet is high in fruits, vegetables, and whole grains. It is low in sodium, red meat, and added sugars. DASH stands for Dietary Approaches to Stop Hypertension. Lifestyle changes  Lose weight if you are overweight. Losing just 3-5% of your body weight can help prevent or control hypertension. For example, if your present weight is 200 lb (91 kg), a loss of 3-5% of your weight means losing 6-10 lb (2.7-4.5 kg). Ask your health care provider to help you with a diet and exercise plan to safely lose weight. Get enough exercise. Do at least 150 minutes of moderate-intensity exercise each week. You could do this in short exercise sessions several times a day, or you could do longer exercise sessions a few times a week. For example, you could take a brisk 10-minute walk or bike ride, 3 times a day, for 5 days a week. Find ways to reduce stress, such as exercising, meditating, listening to  music, or taking a yoga class. If you need help reducing stress, ask your health care provider. Do not use any products that contain nicotine or tobacco. These products include cigarettes, chewing tobacco, and vaping devices, such as e-cigarettes. Chemicals in tobacco and nicotine products raise your  blood pressure each time you use them. If you need help quitting, ask your health care provider. Learn how to check your blood pressure at home. Make sure that you know your personal target blood pressure, as told by your health care provider. Try to sleep 7-9 hours per night. Alcohol use Do not drink alcohol if: Your health care provider tells you not to drink. You are pregnant, may be pregnant, or are planning to become pregnant. If you drink alcohol: Limit how much you have to: 0-1 drink a day for women. 0-2 drinks a day for men. Know how much alcohol is in your drink. In the [place] one drink equals one 12 oz bottle of beer (355 mL), one 5 oz glass of wine (148 mL), or one 1 oz glass of hard liquor (44 mL). Medicines In addition to diet and lifestyle changes, your health care provider may recommend medicines to help lower your blood pressure. In general: You may need to try a few different medicines to find what works best for you. You may need to take more than one medicine. Take over-the-counter and prescription medicines only as told by your health care provider. Questions to ask your health care provider What is my blood pressure goal? How can I lower my risk for high blood pressure? How should I monitor my blood pressure at home? Where to find support Your health care provider can help you prevent hypertension and help you keep your blood pressure at a healthy level. Your local hospital or your community may also provide support services and prevention programs. The American Heart Association offers an online support network at supportnetwork.heart.org Where to find more information Learn more about hypertension from: National Heart, Lung, and Blood Institute: PopSteam.is Centers for Disease Control and Prevention: FootballExhibition.com.br American Academy of Family Physicians: familydoctor.org Learn more about the DASH diet from: National Heart, Lung, and Blood Institute:  PopSteam.is Contact a health care provider if: You think you are having a reaction to medicines you have taken. You have recurrent headaches or feel dizzy. You have swelling in your ankles. You have trouble with your vision. Get help right away if: You have sudden, severe chest, back, or abdominal pain or discomfort. You have shortness of breath. You have a sudden, severe headache. These symptoms may be an emergency. Get help right away. Call 911. Do not wait to see if the symptoms will go away. Do not drive yourself to the hospital. Summary Hypertension often does not cause any symptoms until blood pressure is very high. It is important to get your blood pressure checked regularly. Diet and lifestyle changes are important steps in preventing hypertension. By keeping your blood pressure in a healthy range, you may prevent complications like heart attack, heart failure, stroke, and kidney failure. Work with your health care provider to make a hypertension prevention plan that works for you. This information is not intended to replace advice given to you by your health care provider. Make sure you discuss any questions you have with your health care provider. Document Revised: 09/19/2021 Document Reviewed: 09/19/2021 Elsevier Patient Education  2024 [employer].

## 2024-08-26 NOTE — Progress Notes (Signed)
 [facility]  Edward Wu, is a 63 y.o. male  RDW:[id number]  FMW:[id number]  DOB - 1961-09-13  Chief Complaint  Patient presents with   Medical Management of Chronic Issues       Subjective:   Edward Wu is a 63 y.o. male here today for a follow up for hyperlipidemia . He never picked up prescription for hyperlipidemia. Changed lifestyle eating and exercising. Patient has No headache, No chest pain, No abdominal pain - No Nausea, No new weakness tingling or numbness, No Cough - shortness of breath HPI  No problems updated.  Comprehensive ROS Pertinent positive and negative noted in HPI   No Known Allergies  No past medical history on file.  Current Outpatient Medications on File Prior to Visit  Medication Sig Dispense Refill   atorvastatin  (LIPITOR) 20 MG tablet Take 1 tablet (20 mg total) by mouth daily. (Patient not taking: Reported on 08/26/2024) 90 tablet 3   No current facility-administered medications on file prior to visit.   Health Maintenance  Topic Date Due   Medicare Annual Wellness Visit  Never done   DTaP/Tdap/Td vaccine (1 - Tdap) Never done   Pneumococcal Vaccine for age over 67 (1 of 2 - PCV) Never done   Colon Cancer Screening  Never done   Zoster (Shingles) Vaccine (1 of 2) Never done   Flu Shot  Never done   COVID-19 Vaccine (1 - 2024-25 season) Never done   Hepatitis C Screening  Completed   HIV Screening  Completed   Hepatitis B Vaccine  Aged Out   HPV Vaccine  Aged Out   Meningitis B Vaccine  Aged Out    Objective:   Vitals:   08/26/24 0929  BP: (!) 144/80  Pulse: (!) 53  SpO2: 98%  Weight: 161 lb (73 kg)   BP Readings from Last 3 Encounters:  08/26/24 (!) 144/80  05/13/24 (!) 140/84  05/24/21 140/84      Physical Exam Vitals reviewed.  Constitutional:      Appearance: Normal appearance.  HENT:     Head: Normocephalic.     Right Ear: Tympanic membrane and external ear normal.     Left Ear: External ear  normal. There is impacted cerumen.     Nose: Nose normal.  Eyes:     Extraocular Movements: Extraocular movements intact.     Pupils: Pupils are equal, round, and reactive to light.  Cardiovascular:     Rate and Rhythm: Normal rate and regular rhythm.  Pulmonary:     Effort: Pulmonary effort is normal.     Breath sounds: Normal breath sounds.  Abdominal:     General: Bowel sounds are normal. There is distension.     Palpations: Abdomen is soft.  Musculoskeletal:        General: Normal range of motion.     Cervical back: Normal range of motion.  Skin:    General: Skin is warm and dry.  Neurological:     Mental Status: He is alert and oriented to person, place, and time.  Psychiatric:        Mood and Affect: Mood normal.        Behavior: Behavior normal.        Thought Content: Thought content normal.        Judgment: Judgment normal.      Assessment & Plan  Rannie was seen today for medical management of chronic issues.  Diagnoses and all orders for this visit:  Colon cancer  screening -     Ambulatory referral to Gastroenterology  Mixed hyperlipidemia -     Lipid Panel  Impacted cerumen of right ear Ceruminosis is noted.  Wax is removed by syringing and manual debridement. Instructions for home care to prevent wax buildup are given.        The patient was given clear instructions to go to ER or return to medical center if symptoms don't improve, worsen or new problems develop. The patient verbalized understanding. The patient was told to call to get lab results if they haven't heard anything in the next week.   This note has been created with Education officer, environmental. Any transcriptional errors are unintentional.   Edward SHAUNNA Bohr, NP 08/26/2024, 10:17 AM

## 2024-09-07 ENCOUNTER — Encounter (INDEPENDENT_AMBULATORY_CARE_PROVIDER_SITE_OTHER): Payer: Self-pay | Admitting: Primary Care

## 2024-09-16 ENCOUNTER — Ambulatory Visit (INDEPENDENT_AMBULATORY_CARE_PROVIDER_SITE_OTHER): Admitting: Primary Care

## 2024-09-30 ENCOUNTER — Other Ambulatory Visit (INDEPENDENT_AMBULATORY_CARE_PROVIDER_SITE_OTHER)

## 2024-09-30 NOTE — Addendum Note (Signed)
 Addended by: CASIMIR JUVENAL SAUNDERS on: 09/30/2024 04:13 PM   Modules accepted: Orders

## 2024-10-07 ENCOUNTER — Other Ambulatory Visit (INDEPENDENT_AMBULATORY_CARE_PROVIDER_SITE_OTHER)

## 2024-10-07 DIAGNOSIS — E782 Mixed hyperlipidemia: Secondary | ICD-10-CM

## 2024-10-08 LAB — LIPID PANEL
Chol/HDL Ratio: 5.2 ratio — ABNORMAL HIGH (ref 0.0–5.0)
Cholesterol, Total: 220 mg/dL — ABNORMAL HIGH (ref 100–199)
HDL: 42 mg/dL (ref >39)
LDL Chol Calc (NIH): 150 mg/dL — ABNORMAL HIGH (ref 0–99)
Triglycerides: 153 mg/dL — ABNORMAL HIGH (ref 0–149)
VLDL Cholesterol Cal: 28 mg/dL (ref 5–40)

## 2024-10-10 ENCOUNTER — Ambulatory Visit (INDEPENDENT_AMBULATORY_CARE_PROVIDER_SITE_OTHER): Payer: Self-pay | Admitting: Primary Care

## 2024-10-21 ENCOUNTER — Encounter (INDEPENDENT_AMBULATORY_CARE_PROVIDER_SITE_OTHER): Payer: Self-pay

## 2024-11-28 ENCOUNTER — Telehealth (INDEPENDENT_AMBULATORY_CARE_PROVIDER_SITE_OTHER): Payer: Self-pay | Admitting: Primary Care

## 2024-11-28 NOTE — Telephone Encounter (Signed)
 Copied from CRM #[id number]. Topic: Clinical - Lab/Test Results >> Nov 28, 2024 10:33 AM Larissa RAMAN wrote: Reason for CRM: Relayed lab results per provider note, patient verbalized understanding.  Patient states he is not taking the  cholesterol medication, atorvastatin . He is requesting a callback to address medication.

## 2024-11-28 NOTE — Telephone Encounter (Signed)
 Will forward to provider

## 2025-01-10 ENCOUNTER — Encounter: Payer: Self-pay | Admitting: Gastroenterology
